# Patient Record
Sex: Female | Born: 1960 | Race: White | Hispanic: No | Marital: Married | Smoking: Former smoker
Health system: Southern US, Community
[De-identification: ages and names within clinical notes are randomized; demographics above are authoritative.]

---

## 2020-11-19 ENCOUNTER — Other Ambulatory Visit: Payer: Self-pay | Admitting: Infectious Diseases

## 2020-11-19 DIAGNOSIS — Z1231 Encounter for screening mammogram for malignant neoplasm of breast: Secondary | ICD-10-CM

## 2021-12-08 ENCOUNTER — Other Ambulatory Visit: Payer: Self-pay | Admitting: Infectious Diseases

## 2021-12-08 DIAGNOSIS — Z1231 Encounter for screening mammogram for malignant neoplasm of breast: Secondary | ICD-10-CM

## 2021-12-29 LAB — COLOGUARD: COLOGUARD: NEGATIVE

## 2021-12-29 LAB — EXTERNAL GENERIC LAB PROCEDURE: COLOGUARD: NEGATIVE

## 2022-01-17 ENCOUNTER — Encounter: Payer: Self-pay | Admitting: Radiology

## 2022-01-17 ENCOUNTER — Ambulatory Visit
Admission: RE | Admit: 2022-01-17 | Discharge: 2022-01-17 | Disposition: A | Discharge status: Home / Self Care | Payer: 59 | Source: Ambulatory Visit | Attending: Infectious Diseases | Admitting: Infectious Diseases

## 2022-01-17 DIAGNOSIS — Z1231 Encounter for screening mammogram for malignant neoplasm of breast: Secondary | ICD-10-CM | POA: Diagnosis not present

## 2022-01-26 ENCOUNTER — Inpatient Hospital Stay
Admission: RE | Admit: 2022-01-26 | Discharge: 2022-01-26 | Disposition: A | Discharge status: Home / Self Care | Payer: Self-pay | Source: Ambulatory Visit | Attending: *Deleted | Admitting: *Deleted

## 2022-01-26 ENCOUNTER — Other Ambulatory Visit: Payer: Self-pay | Admitting: *Deleted

## 2022-01-26 DIAGNOSIS — Z1231 Encounter for screening mammogram for malignant neoplasm of breast: Secondary | ICD-10-CM

## 2022-10-04 ENCOUNTER — Encounter: Payer: Self-pay | Admitting: Family Medicine

## 2022-10-04 ENCOUNTER — Ambulatory Visit: Payer: Self-pay | Admitting: Family Medicine

## 2022-10-04 DIAGNOSIS — I10 Essential (primary) hypertension: Secondary | ICD-10-CM | POA: Insufficient documentation

## 2022-10-04 DIAGNOSIS — Z113 Encounter for screening for infections with a predominantly sexual mode of transmission: Secondary | ICD-10-CM

## 2022-10-04 DIAGNOSIS — E119 Type 2 diabetes mellitus without complications: Secondary | ICD-10-CM | POA: Insufficient documentation

## 2022-10-04 DIAGNOSIS — B3731 Acute candidiasis of vulva and vagina: Secondary | ICD-10-CM

## 2022-10-04 LAB — WET PREP FOR TRICH, YEAST, CLUE
Trichomonas Exam: NEGATIVE
Yeast Exam: NEGATIVE

## 2022-10-04 MED ORDER — FLUCONAZOLE 150 MG PO TABS: 150.0000 mg | ORAL_TABLET | Freq: Once | ORAL | 0 refills | Status: AC

## 2022-10-04 MED ORDER — CLOTRIMAZOLE-BETAMETHASONE 1-0.05 % EX CREA: 1.0000 | TOPICAL_CREAM | Freq: Every day | CUTANEOUS | 0 refills | Status: AC

## 2022-10-04 NOTE — Progress Notes (Signed)
[facility] Department  STI clinic/screening visit [address] [place] [address] [phone]  Subjective:  Misty Brady is a 62 y.o. female being seen today for an STI screening visit. The patient reports they do have symptoms.  Patient reports that they do not desire a pregnancy in the next year.   They reported they are not interested in discussing contraception today.    No LMP recorded. Patient is postmenopausal.   Patient has the following medical conditions:   Patient Active Problem List   Diagnosis Date Noted   Type 2 diabetes mellitus without complications (HCC) 10/04/2022   Essential hypertension 10/04/2022    Chief Complaint  Patient presents with   SEXUALLY TRANSMITTED DISEASE    Screening- patient complaining of vaginal itching and burning and discharge     HPI  Patient reports to clinic for testing for yeast.   Does the patient using douching products? No  Last HIV test per patient/review of record was No results found for: "HMHIVSCREEN" No results found for: "HIV" Patient reports last pap was unknown   Screening for MPX risk: Does the patient have an unexplained rash? No Is the patient MSM? No Does the patient endorse multiple sex partners or anonymous sex partners? No Did the patient have close or sexual contact with a person diagnosed with MPX? No Has the patient traveled outside the [place] where MPX is endemic? No Is there a high clinical suspicion for MPX-- evidenced by one of the following No  -Unlikely to be chickenpox  -Lymphadenopathy  -Rash that present in same phase of evolution on any given body part See flowsheet for further details and programmatic requirements.   Immunization history:   There is no immunization history on file for this patient.   The following portions of the patient's history were reviewed and updated as appropriate: allergies, current medications, past medical history, past social  history, past surgical history and problem list.  Objective:  There were no vitals filed for this visit.  Physical Exam Vitals and nursing note reviewed.  Constitutional:      Appearance: Normal appearance.  HENT:     Head: Normocephalic and atraumatic.     Mouth/Throat:     Mouth: Mucous membranes are moist.     Pharynx: Oropharynx is clear. No oropharyngeal exudate or posterior oropharyngeal erythema.  Pulmonary:     Effort: Pulmonary effort is normal.  Abdominal:     General: Abdomen is flat.     Palpations: There is no mass.     Tenderness: There is no abdominal tenderness. There is no rebound.  Genitourinary:    Exam position: Lithotomy position.     Pubic Area: No rash or pubic lice.      Labia:        Right: Lesion present. No rash.        Left: No rash or lesion.      Vagina: Normal. No vaginal discharge, erythema, bleeding or lesions.     Cervix: No cervical motion tenderness, discharge, friability, lesion or erythema.     Uterus: Normal.      Adnexa: Right adnexa normal and left adnexa normal.     Rectum: Normal.       Comments: pH = 5  Multiple lesions on right labia from patient scratching, raw skin Lymphadenopathy:     Head:     Right side of head: No preauricular or posterior auricular adenopathy.     Left side of head: No  preauricular or posterior auricular adenopathy.     Cervical: No cervical adenopathy.     Upper Body:     Right upper body: No supraclavicular, axillary or epitrochlear adenopathy.     Left upper body: No supraclavicular, axillary or epitrochlear adenopathy.     Lower Body: No right inguinal adenopathy. No left inguinal adenopathy.  Skin:    General: Skin is warm and dry.     Findings: No rash.  Neurological:     Mental Status: She is alert and oriented to person, place, and time.      Assessment and Plan:  Misty Brady is a 62 y.o. female presenting to the [facility] Department for STI screening  1.  Screening for venereal disease  - WET PREP FOR [place], YEAST, CLUE  2. Vaginal candidiasis Patient states she has T2DM, has not been taking all meds- lost insurance coverage. Has been urinating frequently, especially at night. Has had yeast infections in the past.  Patient's labia has lesions on it from where patient scratched until bleeding.  Due to extreme discomfort will tx with diflucan x 1 and clotrimazole.  Per lab- due to patient's use of Monistat- difficult to see sample and evaluate. Marked as negative.  - fluconazole (DIFLUCAN) 150 MG tablet; Take 1 tablet (150 mg total) by mouth once for 1 dose.  Dispense: 1 tablet; Refill: 0 - clotrimazole-betamethasone (LOTRISONE) cream; Apply 1 Application topically daily for 7 days.  Dispense: 7 g; Refill: 0  Patient accepted screenings including  wet prep. Patient meets criteria for HepB screening? No. Ordered? No - not indicated Patient meets criteria for HepC screening? Yes. Ordered? Yes  Treat wet prep per standing order Discussed time line for [facility] results and that patient will be called with positive results and encouraged patient to call if she had not heard in 2 weeks.  Counseled to return or seek care for continued or worsening symptoms Recommended condom use with all sex  Patient is currently using  postmenopausal  to prevent pregnancy.    Return if symptoms worsen or fail to improve.  Future Appointments  Date Time Provider Flasher  10/04/2022  1:20 PM Sharlet Salina, FNP [facility] None   Total time spent 20 minutes.  Sharlet Salina, [place]

## 2022-10-04 NOTE — Progress Notes (Addendum)
Pt here for STD screening.  Wet mount results reviewed.  The patient was dispensed Clotrimazole #1  (Lot# [id number], Exp. 10/2023) today. I provided counseling today regarding the medication. We discussed the medication, the side effects and when to call clinic. Patient given the opportunity to ask questions. Questions answered.  Windle Guard, RN

## 2023-02-14 IMAGING — MG MM DIGITAL SCREENING BILAT W/ TOMO AND CAD
8 series · 8 of 24 positions shown · non-contrast
Comparison: Previous exam(s).

CLINICAL DATA: Screening.

EXAM:
DIGITAL SCREENING BILATERAL MAMMOGRAM WITH TOMOSYNTHESIS AND CAD
TECHNIQUE: Bilateral screening digital craniocaudal and mediolateral oblique
mammograms were obtained. Bilateral screening digital breast
tomosynthesis was performed. The images were evaluated with
computer-aided detection.

[R MLO synth-2D]
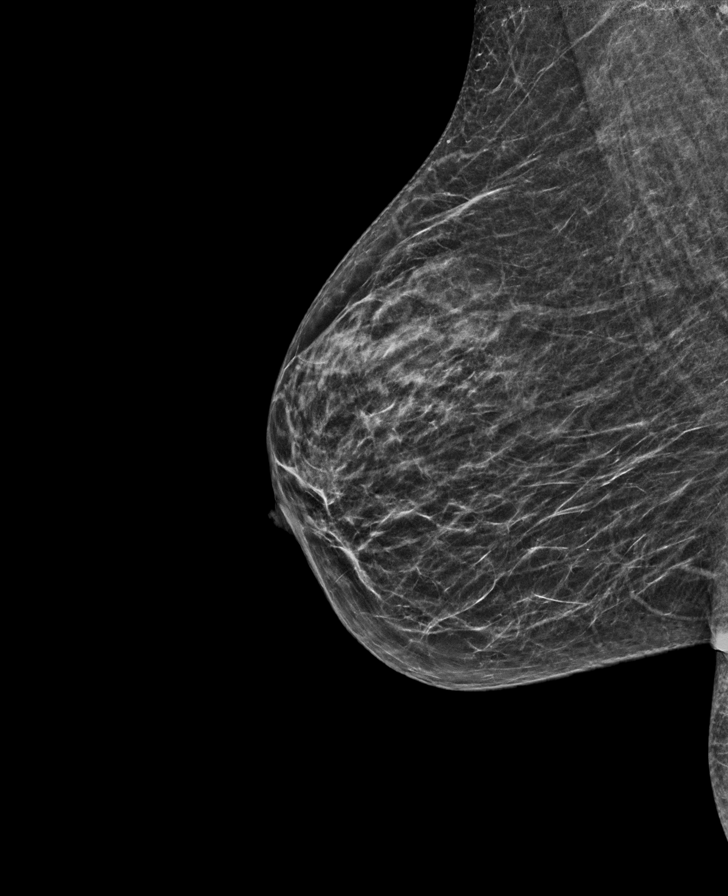

[L CC synth-2D]
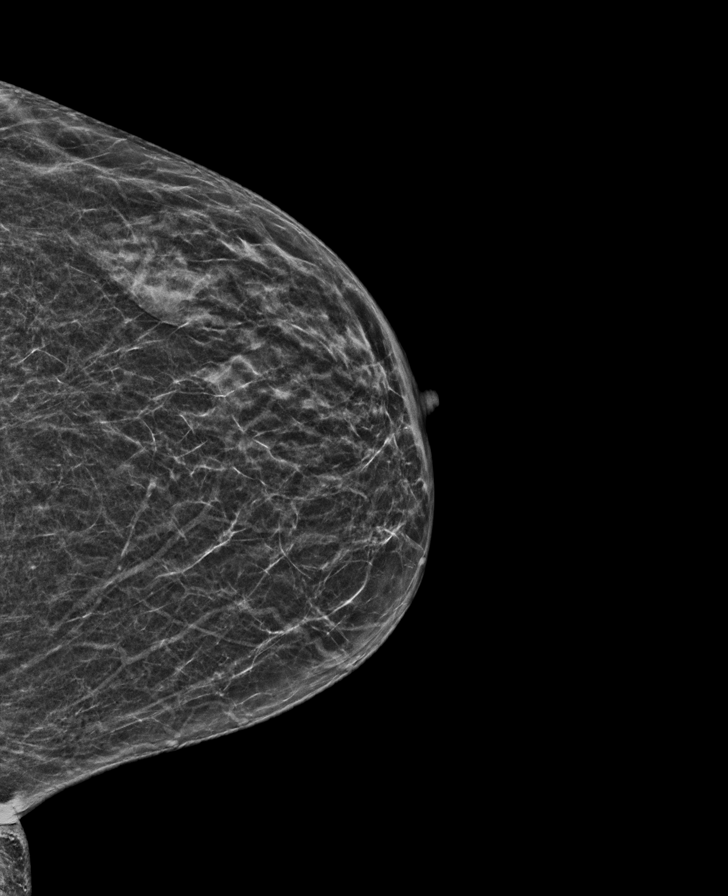

[R CC synth-2D]
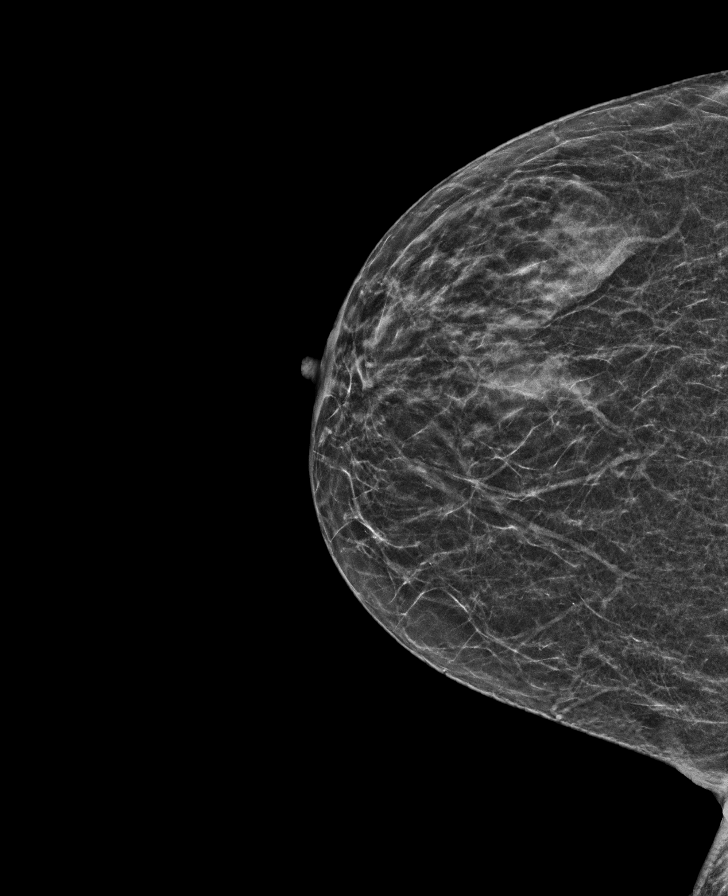

[L MLO synth-2D]
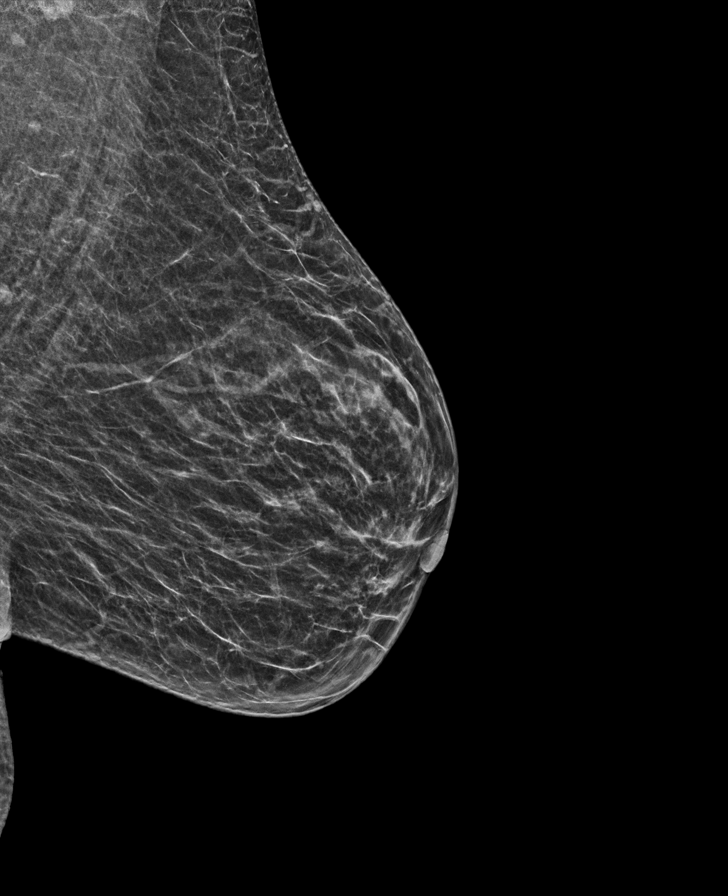

[L CC tomo · tomo slice 23/44.0]
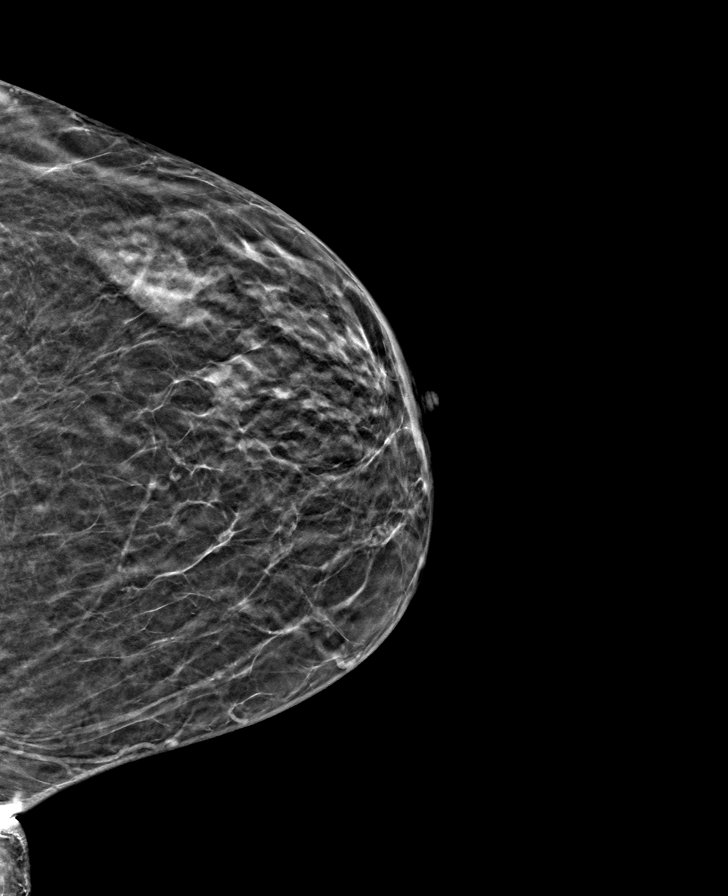

[L MLO tomo · tomo slice 24/47.0]
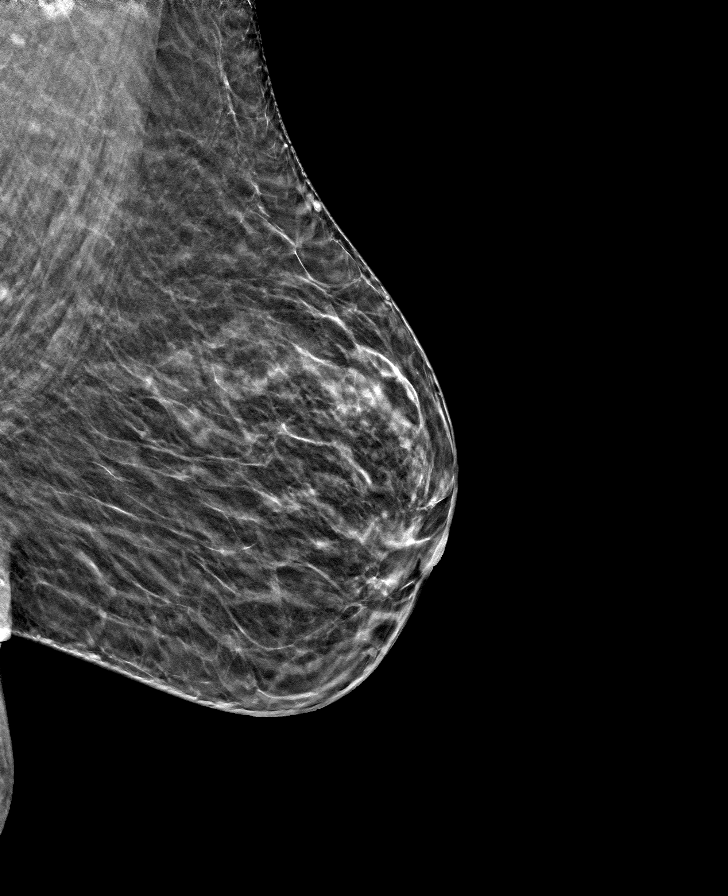

[R CC tomo · tomo slice 23/44.0]
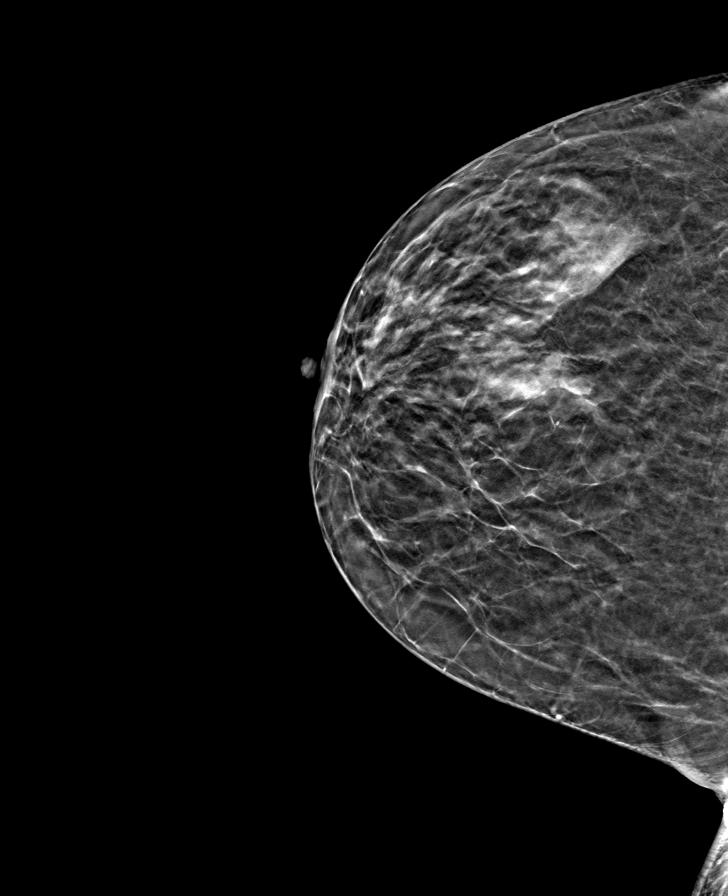

[R MLO tomo · tomo slice 25/49.0]
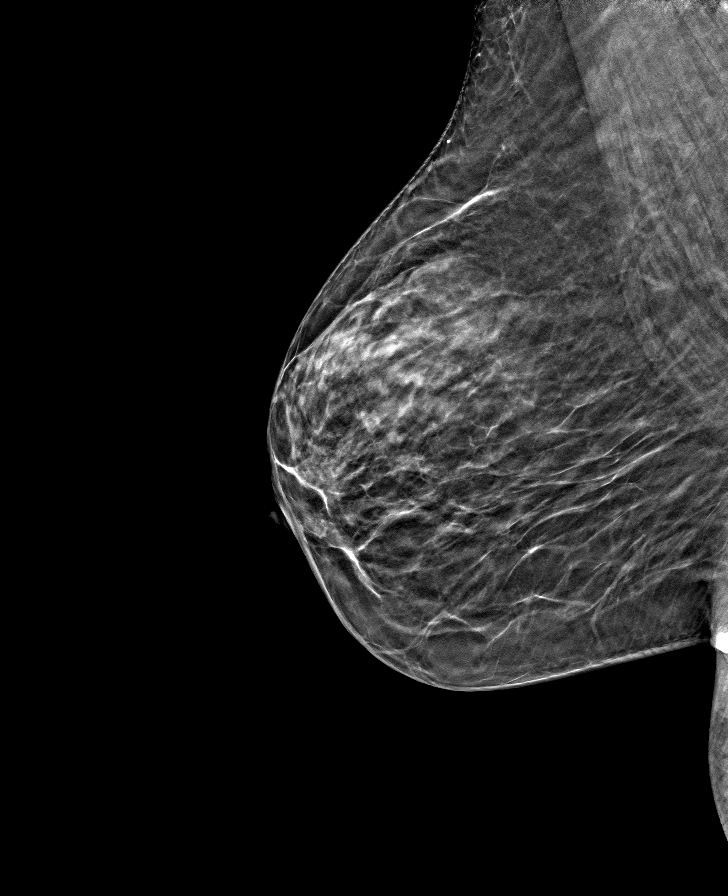

[8 of 24 positions shown; findings below may reference images not displayed]

ACR Breast Density Category b: There are scattered areas of
fibroglandular density.
FINDINGS: There are no findings suspicious for malignancy.
IMPRESSION: No mammographic evidence of malignancy. A result letter of this
screening mammogram will be mailed directly to the patient.

RECOMMENDATION:
Screening mammogram in one year. (Code:51-O-LD2)

BI-RADS CATEGORY  1: Negative.

## 2024-01-25 ENCOUNTER — Other Ambulatory Visit: Payer: Self-pay | Admitting: Internal Medicine

## 2024-01-25 DIAGNOSIS — Z1231 Encounter for screening mammogram for malignant neoplasm of breast: Secondary | ICD-10-CM

## 2024-02-08 ENCOUNTER — Ambulatory Visit
Admission: RE | Admit: 2024-02-08 | Discharge: 2024-02-08 | Disposition: A | Discharge status: Home / Self Care | Payer: Self-pay | Source: Ambulatory Visit | Attending: Internal Medicine | Admitting: Internal Medicine

## 2024-02-08 DIAGNOSIS — Z1231 Encounter for screening mammogram for malignant neoplasm of breast: Secondary | ICD-10-CM | POA: Diagnosis present
